# Patient Record
Sex: Male | Born: 1975 | Race: White | Hispanic: No | Marital: Single | Smoking: Current every day smoker
Health system: Southern US, Community
[De-identification: ages and names within clinical notes are randomized; demographics above are authoritative.]

## PROBLEM LIST (undated history)

## (undated) HISTORY — PX: HAND SURGERY: SHX662

---

## 2014-09-17 ENCOUNTER — Emergency Department (HOSPITAL_COMMUNITY)
Admission: EM | Admit: 2014-09-17 | Discharge: 2014-09-17 | Disposition: A | Discharge status: Home / Self Care | Payer: BC Managed Care – PPO | Attending: Emergency Medicine | Admitting: Emergency Medicine

## 2014-09-17 ENCOUNTER — Emergency Department (HOSPITAL_COMMUNITY): Payer: BC Managed Care – PPO

## 2014-09-17 ENCOUNTER — Encounter (HOSPITAL_COMMUNITY): Payer: Self-pay | Admitting: Emergency Medicine

## 2014-09-17 DIAGNOSIS — Y9389 Activity, other specified: Secondary | ICD-10-CM | POA: Insufficient documentation

## 2014-09-17 DIAGNOSIS — S6991XA Unspecified injury of right wrist, hand and finger(s), initial encounter: Secondary | ICD-10-CM | POA: Diagnosis present

## 2014-09-17 DIAGNOSIS — W2209XA Striking against other stationary object, initial encounter: Secondary | ICD-10-CM | POA: Diagnosis not present

## 2014-09-17 DIAGNOSIS — S60511A Abrasion of right hand, initial encounter: Secondary | ICD-10-CM | POA: Diagnosis not present

## 2014-09-17 DIAGNOSIS — Z23 Encounter for immunization: Secondary | ICD-10-CM | POA: Diagnosis not present

## 2014-09-17 DIAGNOSIS — Y92090 Kitchen in other non-institutional residence as the place of occurrence of the external cause: Secondary | ICD-10-CM | POA: Insufficient documentation

## 2014-09-17 DIAGNOSIS — Z720 Tobacco use: Secondary | ICD-10-CM | POA: Diagnosis not present

## 2014-09-17 DIAGNOSIS — S62310A Displaced fracture of base of second metacarpal bone, right hand, initial encounter for closed fracture: Secondary | ICD-10-CM | POA: Diagnosis not present

## 2014-09-17 DIAGNOSIS — S62306A Unspecified fracture of fifth metacarpal bone, right hand, initial encounter for closed fracture: Secondary | ICD-10-CM

## 2014-09-17 MED ORDER — HYDROCODONE-ACETAMINOPHEN 5-325 MG PO TABS
2.0000 | ORAL_TABLET | Freq: Once | ORAL | Status: AC
  Administered 2014-09-17: 2 via ORAL
  Filled 2014-09-17: qty 2

## 2014-09-17 MED ORDER — IBUPROFEN 800 MG PO TABS
800.0000 mg | ORAL_TABLET | Freq: Once | ORAL | Status: AC
  Administered 2014-09-17: 800 mg via ORAL
  Filled 2014-09-17: qty 1

## 2014-09-17 MED ORDER — TETANUS-DIPHTH-ACELL PERTUSSIS 5-2.5-18.5 LF-MCG/0.5 IM SUSP
0.5000 mL | Freq: Once | INTRAMUSCULAR | Status: AC
  Administered 2014-09-17: 0.5 mL via INTRAMUSCULAR
  Filled 2014-09-17: qty 0.5

## 2014-09-17 MED ORDER — HYDROCODONE-ACETAMINOPHEN 5-325 MG PO TABS: 1.0000 | ORAL_TABLET | Freq: Four times a day (QID) | ORAL | Status: DC | PRN

## 2014-09-17 NOTE — ED Notes (Signed)
Patient reports hitting kitchen cabinet. Has not taken any pain medications today. Able to move right hand with full ROM. No other questions/concerns.

## 2014-09-17 NOTE — Discharge Instructions (Signed)
Call Dr. Mina MarbleWeingold - [phone] on Monday Morning at 8:45 and ask for Alisha. She will help you with getting to surgery. DO NOT EAT OR DRINK ANYTHING AFTER MIDNIGHT ON Sunday 10/25.  Boxer's Fracture You have a break (fracture) of the fifth metacarpal bone. This is commonly called a boxer's fracture. This is the bone in the hand where the little finger attaches. The fracture is in the end of that bone, closest to the little finger. It is usually caused when you hit an object with a clenched fist. Often, the knuckle is pushed down by the impact. Sometimes, the fracture rotates out of position. A boxer's fracture will usually heal within 6 weeks, if it is treated properly and protected from re-injury. Surgery is sometimes needed. A cast, splint, or bulky hand dressing may be used to protect and immobilize a boxer's fracture. Do not remove this device or dressing until your caregiver approves. Keep your hand elevated, and apply ice packs for 15-20 minutes every 2 hours, for the first 2 days. Elevation and ice help reduce swelling and relieve pain. See your caregiver, or an orthopedic specialist, for follow-up care within the next 10 days. This is to make sure your fracture is healing properly. Document Released: 11/11/2005 Document Revised: 02/03/2012 Document Reviewed: 05/01/2007 [facility] Patient Information 2015 [employer] [place]. This information is not intended to replace advice given to you by your health care provider. Make sure you discuss any questions you have with your health care provider.

## 2014-09-17 NOTE — ED Provider Notes (Signed)
CSN: [id number]     Arrival date & time 09/17/14  1805 History   First MD Initiated Contact with Patient 09/17/14 1847     Chief Complaint  Patient presents with  . Hand Injury    right     (Consider location/radiation/quality/duration/timing/severity/associated sxs/prior Treatment) Patient is a 38 y.o. male presenting with hand injury. The history is provided by the patient.  Hand Injury Location:  Hand Time since incident:  2 hours Injury: yes   Mechanism of injury comment:  Punched a cabinet Hand location:  R hand Pain details:    Quality:  Aching   Radiates to:  Does not radiate   Severity:  Moderate   Onset quality:  Sudden   Duration:  2 hours   Timing:  Constant   Progression:  Unchanged Chronicity:  New Handedness:  Right-handed Dislocation: no   Relieved by:  Nothing Worsened by:  Nothing tried Associated symptoms: no fever     History reviewed. No pertinent past medical history. History reviewed. No pertinent past surgical history. History reviewed. No pertinent family history. History  Substance Use Topics  . Smoking status: Current Every Day Smoker -- 1.00 packs/day    Types: Cigarettes  . Smokeless tobacco: Not on file  . Alcohol Use: Yes     Comment: 2 glasses of whiskey    Review of Systems  Constitutional: Negative for fever and chills.  Respiratory: Negative for cough and shortness of breath.   All other systems reviewed and are negative.     Allergies  Review of patient's allergies indicates no known allergies.  Home Medications   Prior to Admission medications   Not on File   BP 105/86  Pulse 88  Temp(Src) 97.6 F (36.4 C) (Oral)  Resp 16  SpO2 100% Physical Exam  Nursing note and vitals reviewed. Constitutional: He is oriented to person, place, and time. He appears well-developed and well-nourished. No distress.  HENT:  Head: Normocephalic and atraumatic.  Mouth/Throat: No oropharyngeal exudate.  Eyes: EOM are normal.  Pupils are equal, round, and reactive to light.  Neck: Normal range of motion. Neck supple.  Cardiovascular: Normal rate and regular rhythm.  Exam reveals no friction rub.   No murmur heard. Pulmonary/Chest: Effort normal and breath sounds normal. No respiratory distress. He has no wheezes. He has no rales.  Abdominal: He exhibits no distension. There is no tenderness. There is no rebound.  Musculoskeletal: Normal range of motion. He exhibits no edema.       Hands: Neurological: He is alert and oriented to person, place, and time.  Skin: He is not diaphoretic.    ED Course  Procedures (including critical care time) Labs Review Labs Reviewed - No data to display  Imaging Review Dg Hand Complete Right  09/17/2014   CLINICAL DATA:  Punched a kitchen cabinet today a few hr ago, pain at fourth and fifth metacarpals, swelling, RIGHT hand injury  EXAM: RIGHT HAND - COMPLETE 3+ VIEW  COMPARISON:  None  FINDINGS: Osseous mineralization normal.  Joint spaces preserved.  Distal RIGHT fifth metacarpal fracture with apex ulnar and dorsal angulation.  Mild volar displacement without definite intra-articular extension.  No additional fracture, dislocation or bone destruction.  IMPRESSION: Displaced and angulated distal RIGHT fifth metacarpal fracture.   Electronically Signed   By: Ulyses SouthwardMark  Boles M.D.   On: 09/17/2014 18:46     EKG Interpretation None      MDM   Final diagnoses:  Fracture of fifth metacarpal bone  of right hand, closed, initial encounter    65M with R hand fracture. Punched a cabinet. Small skin abrasion over MCP. Ulnar angulation when bending fingers. Weingold with Hand Surgery consulted.  Mina MarbleWeingold will see patient Monday for surgery. Tetanus updated. Given pain meds. Ulnar gutter splint placed.  Elwin MochaBlair Yanni Quiroa, MD 09/17/14 [phone]

## 2014-10-01 ENCOUNTER — Emergency Department (HOSPITAL_COMMUNITY): Payer: BC Managed Care – PPO

## 2014-10-01 ENCOUNTER — Emergency Department (HOSPITAL_COMMUNITY)
Admission: EM | Admit: 2014-10-01 | Discharge: 2014-10-01 | Disposition: A | Discharge status: Home / Self Care | Payer: BC Managed Care – PPO | Attending: Emergency Medicine | Admitting: Emergency Medicine

## 2014-10-01 ENCOUNTER — Encounter (HOSPITAL_COMMUNITY): Payer: Self-pay | Admitting: *Deleted

## 2014-10-01 DIAGNOSIS — S62306D Unspecified fracture of fifth metacarpal bone, right hand, subsequent encounter for fracture with routine healing: Secondary | ICD-10-CM | POA: Diagnosis not present

## 2014-10-01 DIAGNOSIS — Z720 Tobacco use: Secondary | ICD-10-CM | POA: Insufficient documentation

## 2014-10-01 DIAGNOSIS — W01198D Fall on same level from slipping, tripping and stumbling with subsequent striking against other object, subsequent encounter: Secondary | ICD-10-CM | POA: Insufficient documentation

## 2014-10-01 DIAGNOSIS — M79641 Pain in right hand: Secondary | ICD-10-CM

## 2014-10-01 DIAGNOSIS — R52 Pain, unspecified: Secondary | ICD-10-CM

## 2014-10-01 MED ORDER — HYDROCODONE-ACETAMINOPHEN 5-325 MG PO TABS: 1.0000 | ORAL_TABLET | Freq: Four times a day (QID) | ORAL | Status: AC | PRN

## 2014-10-01 MED ORDER — IBUPROFEN 200 MG PO TABS
200.0000 mg | ORAL_TABLET | Freq: Once | ORAL | Status: AC
  Administered 2014-10-01: 200 mg via ORAL
  Filled 2014-10-01: qty 1

## 2014-10-01 MED ORDER — IBUPROFEN 800 MG PO TABS: 800.0000 mg | ORAL_TABLET | Freq: Once | ORAL | Status: DC

## 2014-10-01 MED ORDER — IBUPROFEN 200 MG PO TABS
400.0000 mg | ORAL_TABLET | Freq: Once | ORAL | Status: AC
  Administered 2014-10-01: 200 mg via ORAL
  Filled 2014-10-01: qty 2

## 2014-10-01 NOTE — ED Notes (Signed)
Patient was educated not to drive, operate heavy machinery, or drink alcohol while taking narcotic medication.  

## 2014-10-01 NOTE — Discharge Instructions (Signed)
It was our pleasure to provide your ER care today - we hope that you feel better.  Take motrin or aleve as need for pain. You may also take hydrocodone as need for pain. No driving when taking hydrocodone. Also, do not take tylenol or acetaminophen containing medication when taking hydrocodone.  Keep hand/pin area very clean/dry. Elevate hand.   Follow up with your hand doctor in the next couple days for recheck.  Return to ER if worse, new symptoms, fevers, pus from wound, increased swelling, spreading redness, severe pain, other concern.

## 2014-10-01 NOTE — ED Provider Notes (Signed)
CSN: [id number]     Arrival date & time 10/01/14  1156 History   First MD Initiated Contact with Patient 10/01/14 1220     Chief Complaint  Patient presents with  . Hand Pain     (Consider location/radiation/quality/duration/timing/severity/associated sxs/prior Treatment) Patient is a 38 y.o. male presenting with hand pain. The history is provided by the patient.  Hand Pain  pt indicates fractured hand 10/24 as cabinet fell onto hand, fracturing 5th metacarpal.  10/27 pt had surgical repair/pins placed. Pt states in past 1-2 days has run out of pain medication and pain increased as a result.  Pain constant, moderate. Denies recurrent injury or fall. No increased swelling. No redness. No pus from wound or fevers. No erythematous streaking or pain up forearm.       History reviewed. No pertinent past medical history. Past Surgical History  Procedure Laterality Date  . Hand surgery      right 08/2014   History reviewed. No pertinent family history. History  Substance Use Topics  . Smoking status: Current Every Day Smoker -- 1.00 packs/day    Types: Cigarettes  . Smokeless tobacco: Not on file  . Alcohol Use: Yes     Comment: 2 glasses of whiskey    Review of Systems  Constitutional: Negative for fever and chills.  Gastrointestinal: Negative for nausea and vomiting.  Skin: Negative for rash.  Neurological: Negative for weakness and numbness.      Allergies  Review of patient's allergies indicates no known allergies.  Home Medications   Prior to Admission medications   Medication Sig Start Date End Date Taking? Authorizing Provider  HYDROcodone-acetaminophen (NORCO/VICODIN) 5-325 MG per tablet Take 1 tablet by mouth every 6 (six) hours as needed for moderate pain. 09/17/14   Elwin MochaBlair Walden, MD  oxyCODONE-acetaminophen (PERCOCET/ROXICET) 5-325 MG per tablet Take 1 tablet by mouth every 6 (six) hours as needed.  09/12/14   Historical Provider, MD   BP 132/78 mmHg  Pulse  69  Temp(Src) 97.9 F (36.6 C) (Oral)  Resp 17  SpO2 100% Physical Exam  Constitutional: He is oriented to person, place, and time. He appears well-developed and well-nourished. No distress.  HENT:  Mouth/Throat: Oropharynx is clear and moist.  Eyes: Conjunctivae are normal. No scleral icterus.  Neck: Neck supple. No tracheal deviation present.  Cardiovascular: Normal rate.   Pulmonary/Chest: Effort normal. No accessory muscle usage. No respiratory distress.  Musculoskeletal:  2 pins inplace lateral aspect right hand. No purulent drainage from wounds. No erythema or increased warmth of skin of hands, wrist, or forearm. Radial pulse 2+, normal cap refill distally in tips of fingers. No lymphangitis or cellulitis. No pain w slow passive rom digits  Neurological: He is alert and oriented to person, place, and time.  Skin: Skin is warm and dry. He is not diaphoretic.  Psychiatric: He has a normal mood and affect.  Nursing note and vitals reviewed.   ED Course  Procedures (including critical care time)  Dg Hand Complete Right  10/01/2014   CLINICAL DATA:  Pt reports he injured his Rt hand on 10/24, and broke part of hand. [facility]. Pt reports pain has increased pain 7/10. Pt has two pins in hand, unsure if hand is infected. Pt c/o pain on medial side of Rt hand (4th and 5th metacarpals), up the medial side of Rt ulnar region to his Rt elbow.  EXAM: RIGHT HAND - COMPLETE 3+ VIEW  COMPARISON:  None.  FINDINGS: Two K-wires bridging the distal aspect of the fourth and fifth metacarpals. The previously demonstrated comminuted fifth metacarpal head and neck fracture is unchanged. Mild diffuse dorsal and medial soft tissue swelling. No soft tissue gas, bone destruction or periosteal reaction.  IMPRESSION: Stable fifth metacarpal head and neck fracture with K-wire fixation and mild associated soft tissue swelling.   Electronically Signed   By: Gordan PaymentSteve   Reid M.D.   On: 10/01/2014 13:40   Dg Hand Complete Right  09/17/2014   CLINICAL DATA:  Punched a kitchen cabinet today a few hr ago, pain at fourth and fifth metacarpals, swelling, RIGHT hand injury  EXAM: RIGHT HAND - COMPLETE 3+ VIEW  COMPARISON:  None  FINDINGS: Osseous mineralization normal.  Joint spaces preserved.  Distal RIGHT fifth metacarpal fracture with apex ulnar and dorsal angulation.  Mild volar displacement without definite intra-articular extension.  No additional fracture, dislocation or bone destruction.  IMPRESSION: Displaced and angulated distal RIGHT fifth metacarpal fracture.   Electronically Signed   By: Ulyses SouthwardMark  Boles M.D.   On: 09/17/2014 18:46      MDM  Pt removed old/initial dressing. Pins intact. No purulent drainage or erythema. No cellulitis or lymphangiitis noted. + rom digits without severe pain.   Xray.  Pt notes out of pain meds and needs refill.  States is scheduled to follow up with his hand surgeon 11/12, and that he called office yesterday but no response.  Motrin po (pt drove self). Will give pain rx for home, and have contact his hand surgeon Monday.  Also instructed to return to ED if worse, new symptoms, fevers, redness, increased swelling, pus from wound.   afeb without sign of hand or wound infection.  Pt appears stable for d/c.     Suzi RootsKevin E Jayden Kratochvil, MD 10/01/14 1351

## 2014-10-01 NOTE — ED Notes (Addendum)
Pt reports he injured his hand on 10/24, and broke part of hand. Pt [facility]. Pt reports pain has increased pain 7/10. Pt has two pins in hand, unsure if hand is infected.

## 2014-10-01 NOTE — ED Notes (Signed)
Dr. Steinl at bedside 

## 2015-12-23 IMAGING — CR DG HAND COMPLETE 3+V*R*
4 series · 4 of 4 positions shown · non-contrast
Comparison: None

CLINICAL DATA: Punched a kitchen cabinet today a few hr ago, pain
at fourth and fifth metacarpals, swelling, RIGHT hand injury

EXAM:
RIGHT HAND - COMPLETE 3+ VIEW

[x hand pa right]
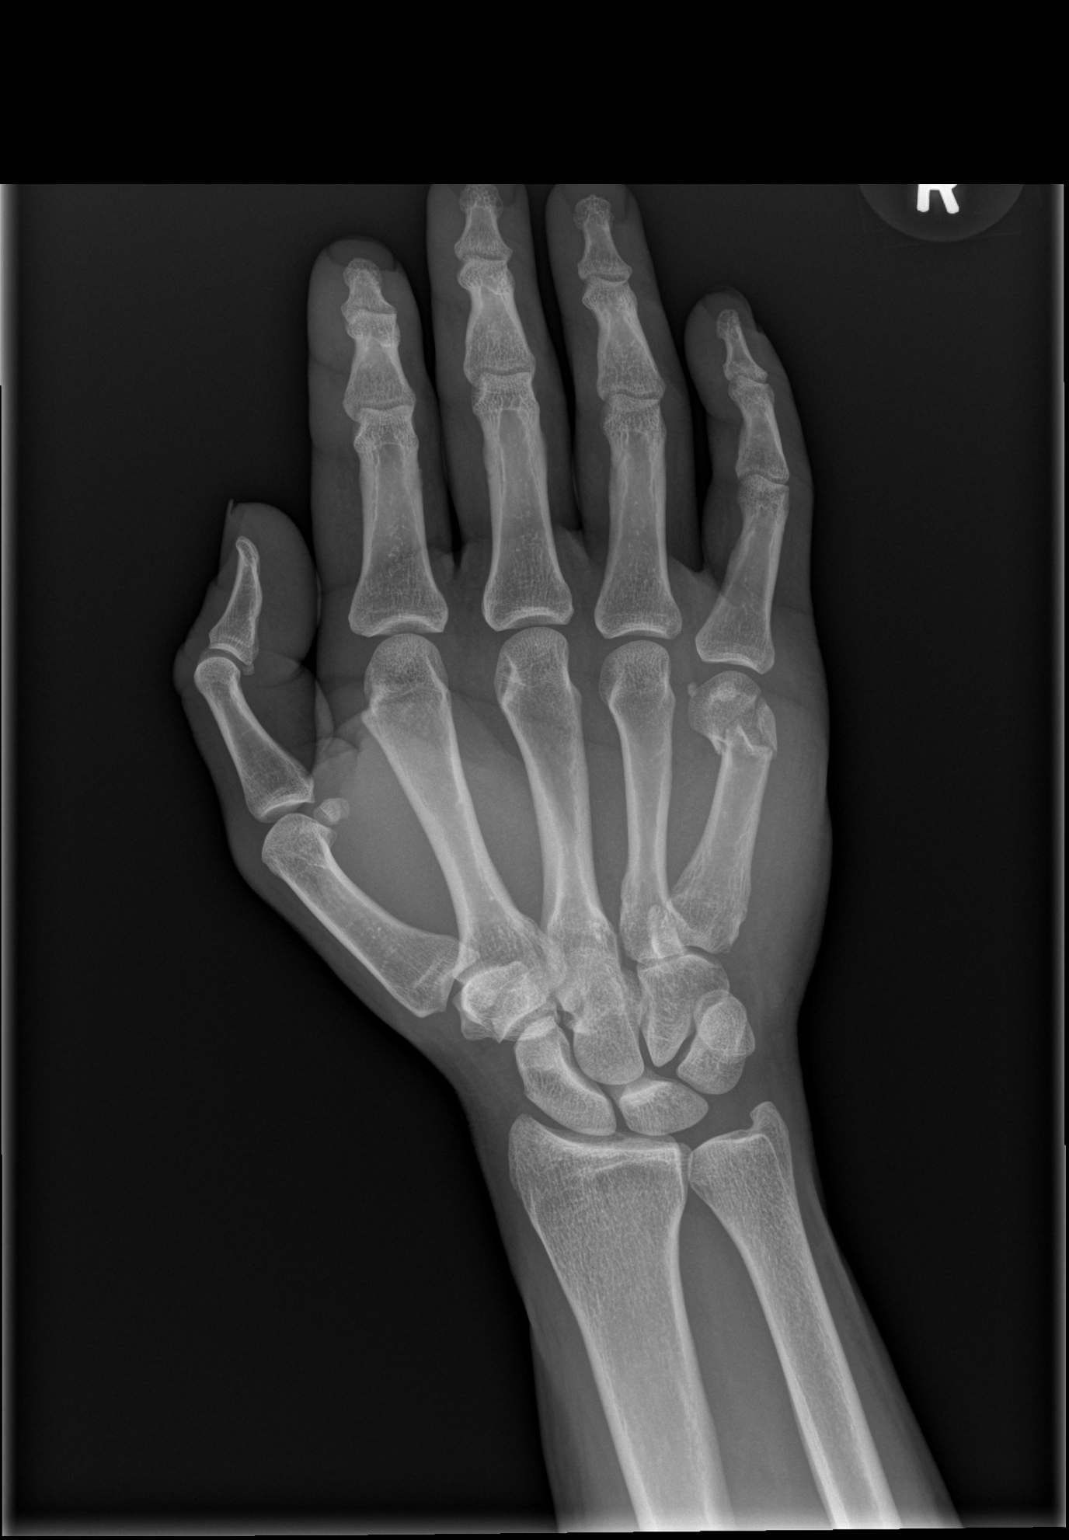

[x hand obl right]
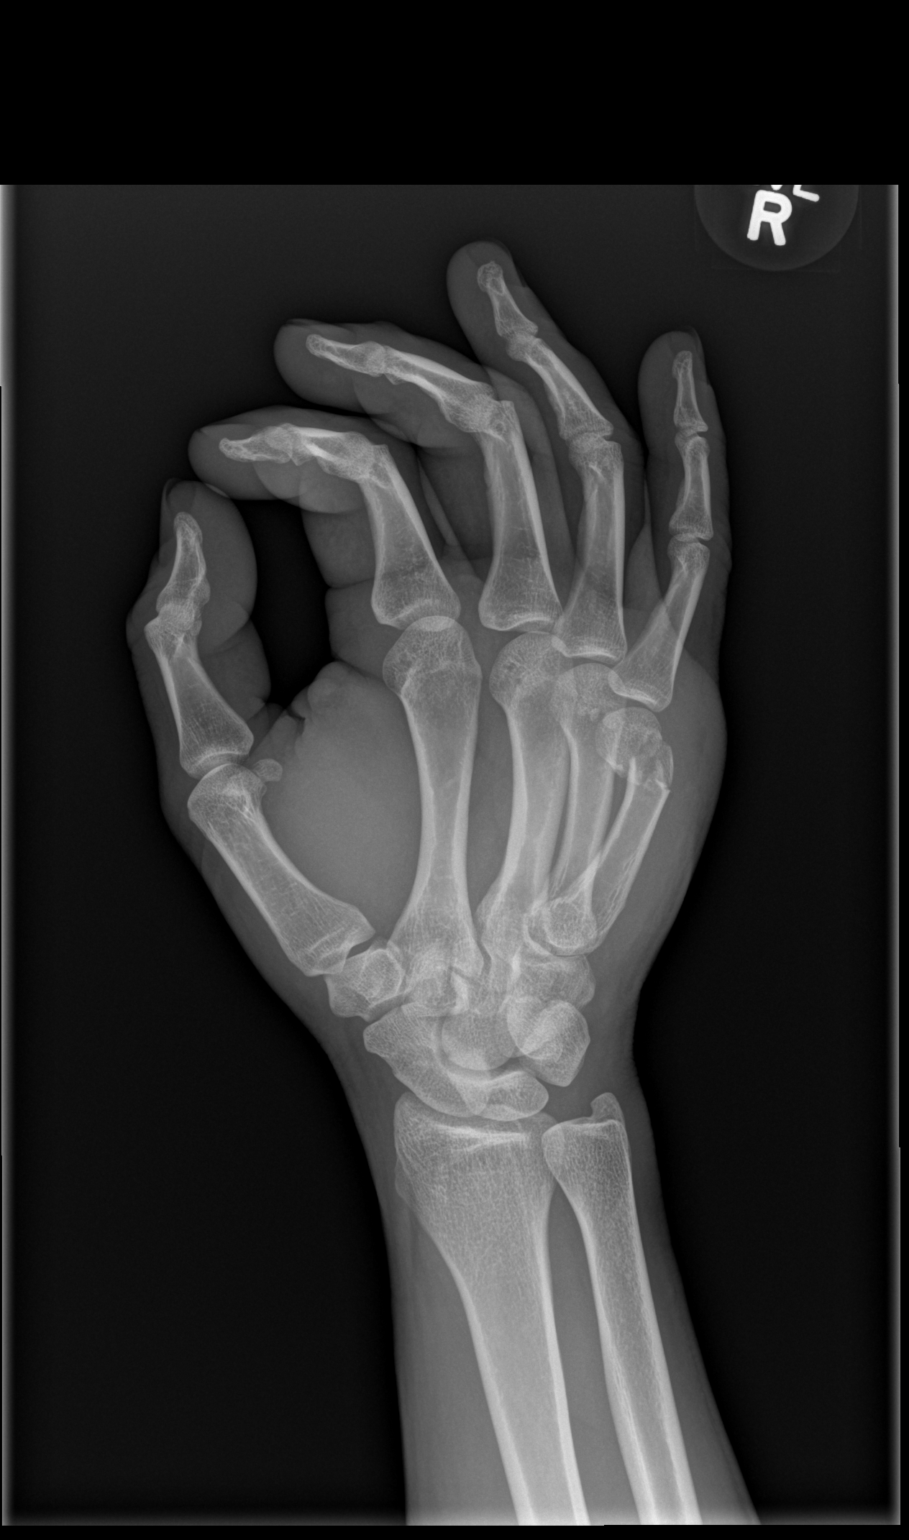

[x hand lat right (1 of 2)]
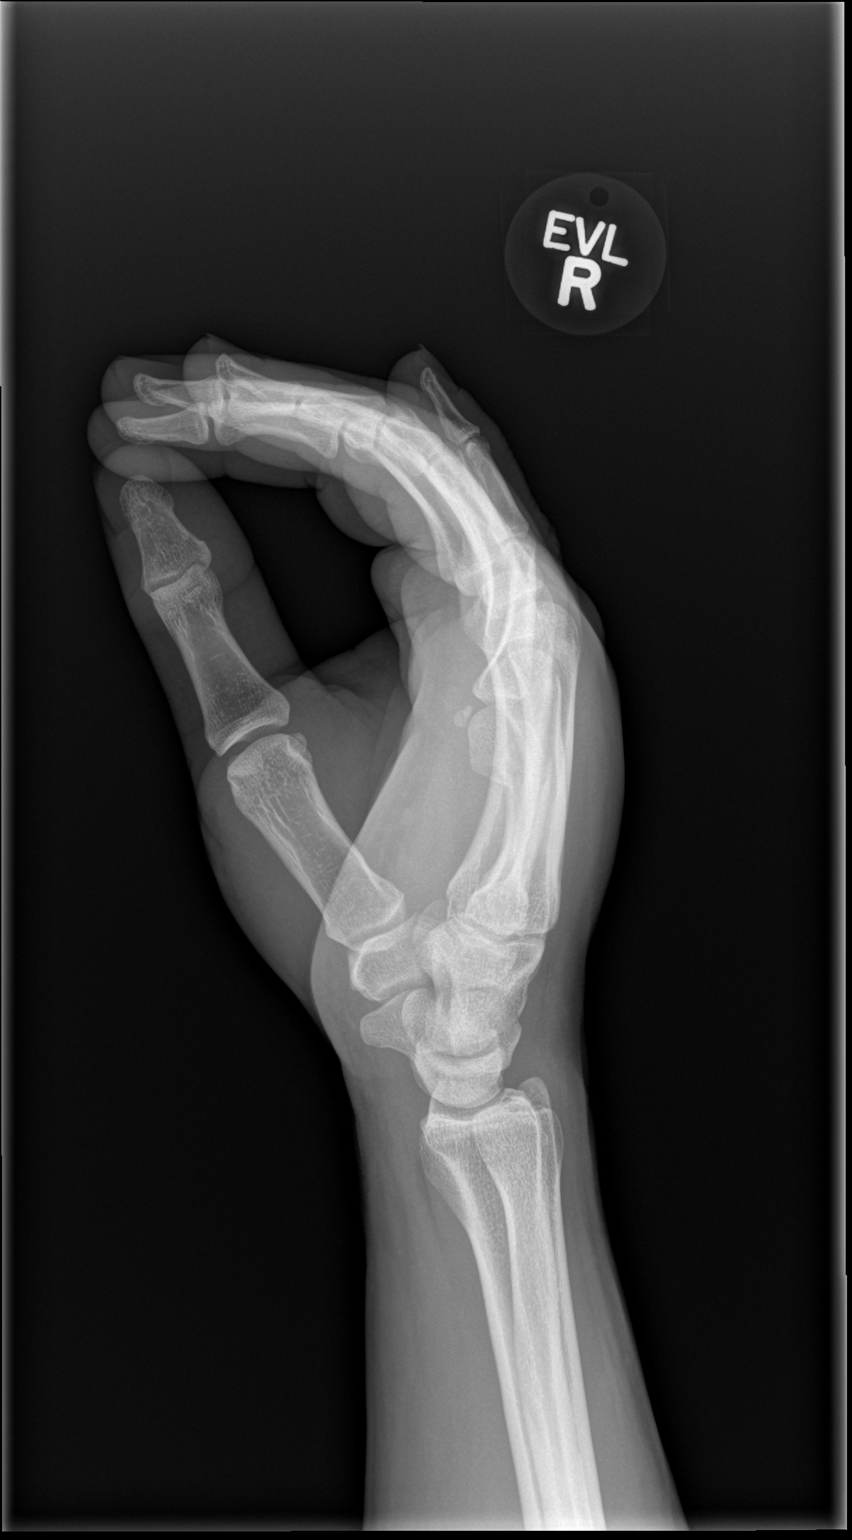

[x hand lat right (2 of 2)]
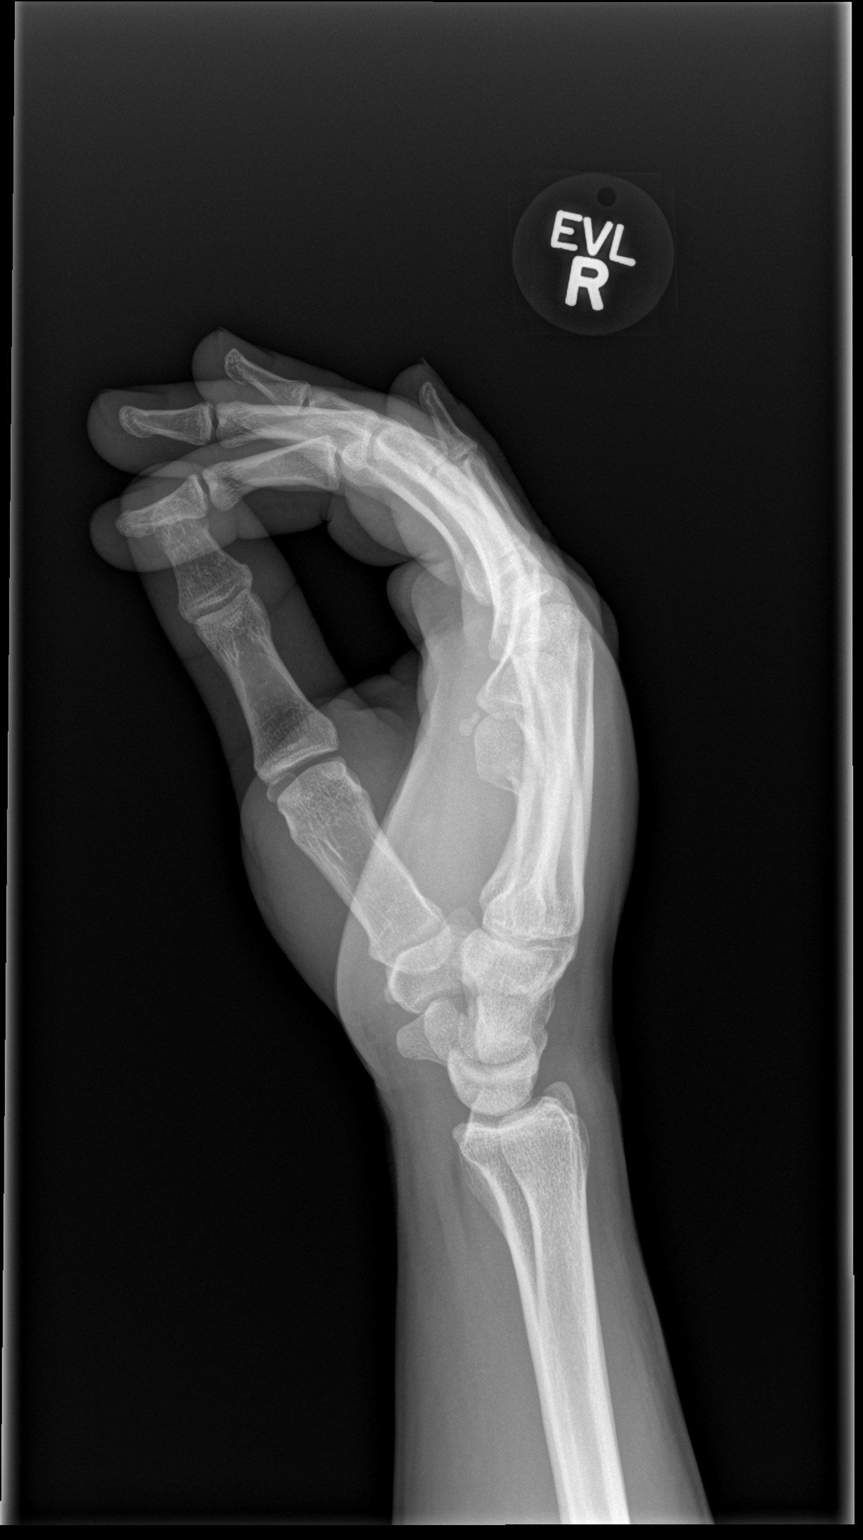

[4 of 4 positions shown; findings below may reference images not displayed]

FINDINGS: Osseous mineralization normal.

Joint spaces preserved.

Distal RIGHT fifth metacarpal fracture with apex ulnar and dorsal
angulation.

Mild volar displacement without definite intra-articular extension.

No additional fracture, dislocation or bone destruction.
IMPRESSION: Displaced and angulated distal RIGHT fifth metacarpal fracture.
